# Patient Record
Sex: Male | Born: 1975 | Race: White | Hispanic: Yes | Marital: Married | Smoking: Light tobacco smoker
Health system: Southern US, Community
[De-identification: ages and names within clinical notes are randomized; demographics above are authoritative.]

## PROBLEM LIST (undated history)

## (undated) DIAGNOSIS — Z789 Other specified health status: Secondary | ICD-10-CM

## (undated) HISTORY — PX: NO PAST SURGERIES: SHX2092

---

## 2004-12-26 ENCOUNTER — Ambulatory Visit: Payer: Self-pay | Admitting: Sports Medicine

## 2005-01-11 ENCOUNTER — Ambulatory Visit: Payer: Self-pay | Admitting: Family Medicine

## 2005-02-21 ENCOUNTER — Ambulatory Visit: Payer: Self-pay | Admitting: Family Medicine

## 2005-03-21 ENCOUNTER — Ambulatory Visit: Payer: Self-pay | Admitting: Family Medicine

## 2005-05-23 ENCOUNTER — Ambulatory Visit: Payer: Self-pay | Admitting: Family Medicine

## 2005-06-27 ENCOUNTER — Ambulatory Visit: Payer: Self-pay | Admitting: Family Medicine

## 2007-09-19 ENCOUNTER — Ambulatory Visit: Payer: Self-pay | Admitting: Family Medicine

## 2007-09-19 ENCOUNTER — Encounter: Payer: Self-pay | Admitting: Family Medicine

## 2007-09-19 LAB — CONVERTED CEMR LAB
ALT: 71 units/L — ABNORMAL HIGH (ref 0–53)
Basophils Absolute: 0 10*3/uL (ref 0.0–0.1)
CO2: 24 meq/L (ref 19–32)
Calcium: 9.4 mg/dL (ref 8.4–10.5)
Chloride: 101 meq/L (ref 96–112)
Creatinine, Ser: 0.85 mg/dL (ref 0.40–1.50)
Hemoglobin: 15.2 g/dL (ref 13.0–17.0)
Lymphocytes Relative: 20 % (ref 12–46)
Lymphs Abs: 2.1 10*3/uL (ref 0.7–3.3)
Monocytes Absolute: 1.6 10*3/uL — ABNORMAL HIGH (ref 0.2–0.7)
Monocytes Relative: 15 % — ABNORMAL HIGH (ref 3–11)
Neutro Abs: 6.3 10*3/uL (ref 1.7–7.7)
RBC: 4.65 M/uL (ref 4.22–5.81)
WBC: 10.1 10*3/uL (ref 4.0–10.5)

## 2007-09-20 DIAGNOSIS — F17200 Nicotine dependence, unspecified, uncomplicated: Secondary | ICD-10-CM | POA: Insufficient documentation

## 2007-09-30 ENCOUNTER — Ambulatory Visit: Payer: Self-pay | Admitting: Family Medicine

## 2007-09-30 ENCOUNTER — Encounter (INDEPENDENT_AMBULATORY_CARE_PROVIDER_SITE_OTHER): Payer: Self-pay | Admitting: Family Medicine

## 2007-09-30 DIAGNOSIS — R51 Headache: Secondary | ICD-10-CM | POA: Insufficient documentation

## 2007-09-30 DIAGNOSIS — R6889 Other general symptoms and signs: Secondary | ICD-10-CM

## 2007-09-30 DIAGNOSIS — R519 Headache, unspecified: Secondary | ICD-10-CM | POA: Insufficient documentation

## 2007-10-02 LAB — CONVERTED CEMR LAB
Albumin: 4.1 g/dL (ref 3.5–5.2)
CO2: 26 meq/L (ref 19–32)
Calcium: 9.3 mg/dL (ref 8.4–10.5)
Chloride: 103 meq/L (ref 96–112)
Cholesterol: 163 mg/dL (ref 0–200)
Eosinophils Relative: 3 % (ref 0–5)
Glucose, Bld: 94 mg/dL (ref 70–99)
HCT: 45.5 % (ref 39.0–52.0)
Lymphocytes Relative: 15 % (ref 12–46)
Lymphs Abs: 1.7 10*3/uL (ref 0.7–4.0)
Neutrophils Relative %: 75 % (ref 43–77)
Platelets: 314 10*3/uL (ref 150–400)
Potassium: 4.6 meq/L (ref 3.5–5.3)
Sodium: 140 meq/L (ref 135–145)
Total Protein: 7.9 g/dL (ref 6.0–8.3)
Triglycerides: 108 mg/dL (ref <150)
WBC: 11.4 10*3/uL — ABNORMAL HIGH (ref 4.0–10.5)

## 2008-08-28 ENCOUNTER — Ambulatory Visit: Payer: Self-pay | Admitting: Family Medicine

## 2015-02-28 ENCOUNTER — Emergency Department (HOSPITAL_COMMUNITY): Admission: EM | Admit: 2015-02-28 | Discharge: 2015-02-28 | Discharge status: Absent without leave | Payer: Self-pay

## 2015-02-28 ENCOUNTER — Emergency Department (HOSPITAL_COMMUNITY)
Admission: EM | Admit: 2015-02-28 | Discharge: 2015-02-28 | Disposition: A | Discharge status: Home / Self Care | Payer: Self-pay | Attending: Emergency Medicine | Admitting: Emergency Medicine

## 2015-02-28 ENCOUNTER — Encounter (HOSPITAL_COMMUNITY): Payer: Self-pay | Admitting: *Deleted

## 2015-02-28 ENCOUNTER — Emergency Department (HOSPITAL_COMMUNITY): Payer: Self-pay

## 2015-02-28 DIAGNOSIS — Y288XXA Contact with other sharp object, undetermined intent, initial encounter: Secondary | ICD-10-CM | POA: Insufficient documentation

## 2015-02-28 DIAGNOSIS — S66321A Laceration of extensor muscle, fascia and tendon of left index finger at wrist and hand level, initial encounter: Secondary | ICD-10-CM | POA: Insufficient documentation

## 2015-02-28 DIAGNOSIS — Z23 Encounter for immunization: Secondary | ICD-10-CM | POA: Insufficient documentation

## 2015-02-28 DIAGNOSIS — Y929 Unspecified place or not applicable: Secondary | ICD-10-CM | POA: Insufficient documentation

## 2015-02-28 DIAGNOSIS — Y999 Unspecified external cause status: Secondary | ICD-10-CM | POA: Insufficient documentation

## 2015-02-28 DIAGNOSIS — S66822A Laceration of other specified muscles, fascia and tendons at wrist and hand level, left hand, initial encounter: Secondary | ICD-10-CM

## 2015-02-28 DIAGNOSIS — S61402A Unspecified open wound of left hand, initial encounter: Secondary | ICD-10-CM

## 2015-02-28 DIAGNOSIS — Y939 Activity, unspecified: Secondary | ICD-10-CM | POA: Insufficient documentation

## 2015-02-28 DIAGNOSIS — S61412A Laceration without foreign body of left hand, initial encounter: Secondary | ICD-10-CM | POA: Insufficient documentation

## 2015-02-28 MED ORDER — LIDOCAINE-EPINEPHRINE 1 %-1:100000 IJ SOLN
10.0000 mL | Freq: Once | INTRAMUSCULAR | Status: AC
  Administered 2015-02-28: 10 mL
  Filled 2015-02-28: qty 1

## 2015-02-28 MED ORDER — TETANUS-DIPHTH-ACELL PERTUSSIS 5-2.5-18.5 LF-MCG/0.5 IM SUSP
0.5000 mL | Freq: Once | INTRAMUSCULAR | Status: AC
  Administered 2015-02-28: 0.5 mL via INTRAMUSCULAR
  Filled 2015-02-28: qty 0.5

## 2015-02-28 MED ORDER — CEPHALEXIN 250 MG PO CAPS
500.0000 mg | ORAL_CAPSULE | Freq: Once | ORAL | Status: AC
  Administered 2015-02-28: 500 mg via ORAL
  Filled 2015-02-28: qty 2

## 2015-02-28 MED ORDER — CEPHALEXIN 500 MG PO CAPS: 500.0000 mg | ORAL_CAPSULE | Freq: Four times a day (QID) | ORAL | Status: DC

## 2015-02-28 MED ORDER — TRAMADOL HCL 50 MG PO TABS: 50.0000 mg | ORAL_TABLET | Freq: Four times a day (QID) | ORAL | Status: DC | PRN

## 2015-02-28 MED ORDER — OXYCODONE-ACETAMINOPHEN 5-325 MG PO TABS
2.0000 | ORAL_TABLET | Freq: Once | ORAL | Status: AC
  Administered 2015-02-28: 2 via ORAL
  Filled 2015-02-28: qty 2
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
## 2015-02-28 NOTE — ED Notes (Signed)
Patient transported to X-ray 

## 2015-02-28 NOTE — ED Notes (Signed)
Dr. Juleen Chinakohut at bedside suturing wound to left hand

## 2015-02-28 NOTE — Discharge Instructions (Signed)
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
## 2017-02-10 ENCOUNTER — Encounter (HOSPITAL_COMMUNITY): Payer: Self-pay | Admitting: Vascular Surgery

## 2017-02-10 ENCOUNTER — Emergency Department (HOSPITAL_COMMUNITY)
Admission: EM | Admit: 2017-02-10 | Discharge: 2017-02-10 | Disposition: A | Discharge status: Home / Self Care | Payer: Self-pay | Attending: Emergency Medicine | Admitting: Emergency Medicine

## 2017-02-10 ENCOUNTER — Emergency Department (HOSPITAL_COMMUNITY): Payer: Self-pay

## 2017-02-10 DIAGNOSIS — F17210 Nicotine dependence, cigarettes, uncomplicated: Secondary | ICD-10-CM | POA: Insufficient documentation

## 2017-02-10 DIAGNOSIS — Y92410 Unspecified street and highway as the place of occurrence of the external cause: Secondary | ICD-10-CM | POA: Insufficient documentation

## 2017-02-10 DIAGNOSIS — S43102A Unspecified dislocation of left acromioclavicular joint, initial encounter: Secondary | ICD-10-CM | POA: Insufficient documentation

## 2017-02-10 DIAGNOSIS — Y9389 Activity, other specified: Secondary | ICD-10-CM | POA: Insufficient documentation

## 2017-02-10 DIAGNOSIS — Y999 Unspecified external cause status: Secondary | ICD-10-CM | POA: Insufficient documentation

## 2017-02-10 MED ORDER — NAPROXEN 250 MG PO TABS
500.0000 mg | ORAL_TABLET | Freq: Once | ORAL | Status: AC
  Administered 2017-02-10: 500 mg via ORAL
  Filled 2017-02-10: qty 2

## 2017-02-10 MED ORDER — NAPROXEN 500 MG PO TABS: 500.0000 mg | ORAL_TABLET | Freq: Two times a day (BID) | ORAL | 0 refills | Status: DC
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
## 2017-02-10 NOTE — Discharge Instructions (Signed)
Take the medications as needed for pain, use the sling, follow up with an orthopedic doctor, call to schedule an appointment

## 2017-02-10 NOTE — Progress Notes (Signed)
Orthopedic Tech Progress Note Patient Details:  Bruce Lee [birth year] [id number]  Ortho Devices Type of Ortho Device: Shoulder immobilizer Ortho Device/Splint Location: LUE Ortho Device/Splint Interventions: Ordered, Application   Jennye Moccasin 02/10/2017, 11:09 PM
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
## 2017-03-26 ENCOUNTER — Emergency Department (HOSPITAL_COMMUNITY): Payer: Self-pay

## 2017-03-26 ENCOUNTER — Emergency Department (HOSPITAL_COMMUNITY)
Admission: EM | Admit: 2017-03-26 | Discharge: 2017-03-26 | Disposition: A | Discharge status: Home / Self Care | Payer: Self-pay | Attending: Emergency Medicine | Admitting: Emergency Medicine

## 2017-03-26 ENCOUNTER — Encounter (HOSPITAL_COMMUNITY): Payer: Self-pay

## 2017-03-26 DIAGNOSIS — Y999 Unspecified external cause status: Secondary | ICD-10-CM | POA: Insufficient documentation

## 2017-03-26 DIAGNOSIS — S01111A Laceration without foreign body of right eyelid and periocular area, initial encounter: Secondary | ICD-10-CM | POA: Insufficient documentation

## 2017-03-26 DIAGNOSIS — Z23 Encounter for immunization: Secondary | ICD-10-CM | POA: Insufficient documentation

## 2017-03-26 DIAGNOSIS — S0181XA Laceration without foreign body of other part of head, initial encounter: Secondary | ICD-10-CM

## 2017-03-26 DIAGNOSIS — Y929 Unspecified place or not applicable: Secondary | ICD-10-CM | POA: Insufficient documentation

## 2017-03-26 DIAGNOSIS — Y939 Activity, unspecified: Secondary | ICD-10-CM | POA: Insufficient documentation

## 2017-03-26 DIAGNOSIS — S0083XA Contusion of other part of head, initial encounter: Secondary | ICD-10-CM

## 2017-03-26 DIAGNOSIS — S0990XA Unspecified injury of head, initial encounter: Secondary | ICD-10-CM | POA: Insufficient documentation

## 2017-03-26 MED ORDER — LIDOCAINE-EPINEPHRINE (PF) 2 %-1:200000 IJ SOLN
20.0000 mL | Freq: Once | INTRAMUSCULAR | Status: AC
  Administered 2017-03-26: 20 mL
  Filled 2017-03-26: qty 20

## 2017-03-26 MED ORDER — TETANUS-DIPHTH-ACELL PERTUSSIS 5-2.5-18.5 LF-MCG/0.5 IM SUSP
0.5000 mL | Freq: Once | INTRAMUSCULAR | Status: AC
  Administered 2017-03-26: 0.5 mL via INTRAMUSCULAR
  Filled 2017-03-26: qty 0.5

## 2017-03-26 MED ORDER — BACITRACIN ZINC 500 UNIT/GM EX OINT
TOPICAL_OINTMENT | CUTANEOUS | Status: AC
  Filled 2017-03-26: qty 2.7

## 2017-03-26 NOTE — Discharge Instructions (Signed)
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
## 2018-05-27 ENCOUNTER — Emergency Department (HOSPITAL_COMMUNITY)
Admission: EM | Admit: 2018-05-27 | Discharge: 2018-05-27 | Disposition: A | Discharge status: Home / Self Care | Payer: PRIVATE HEALTH INSURANCE | Attending: Emergency Medicine | Admitting: Emergency Medicine

## 2018-05-27 ENCOUNTER — Emergency Department (HOSPITAL_COMMUNITY): Payer: PRIVATE HEALTH INSURANCE

## 2018-05-27 ENCOUNTER — Other Ambulatory Visit: Payer: Self-pay | Admitting: Orthopedic Surgery

## 2018-05-27 ENCOUNTER — Other Ambulatory Visit: Payer: Self-pay

## 2018-05-27 ENCOUNTER — Encounter (HOSPITAL_COMMUNITY): Payer: Self-pay | Admitting: Emergency Medicine

## 2018-05-27 ENCOUNTER — Encounter (HOSPITAL_COMMUNITY): Payer: Self-pay | Admitting: *Deleted

## 2018-05-27 DIAGNOSIS — S62232A Other displaced fracture of base of first metacarpal bone, left hand, initial encounter for closed fracture: Secondary | ICD-10-CM

## 2018-05-27 DIAGNOSIS — Y9289 Other specified places as the place of occurrence of the external cause: Secondary | ICD-10-CM | POA: Diagnosis not present

## 2018-05-27 DIAGNOSIS — S61012A Laceration without foreign body of left thumb without damage to nail, initial encounter: Secondary | ICD-10-CM | POA: Diagnosis not present

## 2018-05-27 DIAGNOSIS — Y990 Civilian activity done for income or pay: Secondary | ICD-10-CM | POA: Insufficient documentation

## 2018-05-27 DIAGNOSIS — Y939 Activity, unspecified: Secondary | ICD-10-CM | POA: Diagnosis not present

## 2018-05-27 DIAGNOSIS — W269XXA Contact with unspecified sharp object(s), initial encounter: Secondary | ICD-10-CM | POA: Insufficient documentation

## 2018-05-27 DIAGNOSIS — S62515A Nondisplaced fracture of proximal phalanx of left thumb, initial encounter for closed fracture: Secondary | ICD-10-CM | POA: Insufficient documentation

## 2018-05-27 DIAGNOSIS — S6992XA Unspecified injury of left wrist, hand and finger(s), initial encounter: Secondary | ICD-10-CM | POA: Diagnosis present

## 2018-05-27 DIAGNOSIS — Z23 Encounter for immunization: Secondary | ICD-10-CM | POA: Insufficient documentation

## 2018-05-27 MED ORDER — IBUPROFEN 800 MG PO TABS: 800.0000 mg | ORAL_TABLET | Freq: Three times a day (TID) | ORAL | 0 refills | Status: AC

## 2018-05-27 MED ORDER — IBUPROFEN 800 MG PO TABS
800.0000 mg | ORAL_TABLET | Freq: Once | ORAL | Status: AC
  Administered 2018-05-27: 800 mg via ORAL
  Filled 2018-05-27: qty 1

## 2018-05-27 MED ORDER — TETANUS-DIPHTH-ACELL PERTUSSIS 5-2.5-18.5 LF-MCG/0.5 IM SUSP
0.5000 mL | Freq: Once | INTRAMUSCULAR | Status: AC
  Administered 2018-05-27: 0.5 mL via INTRAMUSCULAR
  Filled 2018-05-27: qty 0.5

## 2018-05-27 MED ORDER — LIDOCAINE HCL (PF) 1 % IJ SOLN
30.0000 mL | Freq: Once | INTRAMUSCULAR | Status: AC
  Administered 2018-05-27: 30 mL
  Filled 2018-05-27: qty 30

## 2018-05-27 NOTE — ED Triage Notes (Signed)
Pt states 40 minutes ago he cut his left thumb and left middle finger while placing concrete, the cut was on metal. Pt has pain to thumb joint as well.

## 2018-05-27 NOTE — Progress Notes (Signed)
Orthopedic Tech Progress Note Patient Details:  [name] Mar 21, 1976 [id number]  Ortho Devices Type of Ortho Device: Ace wrap, Thumb spica splint Splint Material: Plaster Ortho Device/Splint Location: lue Ortho Device/Splint Interventions: Application   Post Interventions Patient Tolerated: Well Instructions Provided: Care of device   Nikki DomCrawford, Dwan Hemmelgarn 05/27/2018, 3:55 PM

## 2018-05-27 NOTE — H&P (View-Only) (Signed)
Reason for Consult:Left thumb fx Referring Physician: D [name] is an 42 y.o. male.  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 His left hand was on the ground and the plate of the machine came around and struck his hand. He came to the ED for evaluation. X-rays showed 1st prox phalanx and MC fxs and hand surgery was consulted. He is RHD.  History reviewed. No pertinent past medical history.  No past surgical history on file.  No family history on file.  Social History:  reports that he has never smoked. He has never used smokeless tobacco. He reports that he drinks alcohol. He reports that he does not use drugs.  Allergies: No Known Allergies  Medications: I have reviewed the patient's current medications.  No results found for this or any previous visit (from the past 48 hour(s)).  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 FINDINGS: Transverse oblique fracture of the proximal phalanx of thumb, non comminuted and nondisplaced with no significant angulation. This extends from the midshaft to the proximal metaphysis. It does not involve the articular surface. Comminuted transverse fracture of the base of the fifth metacarpal, mildly displaced in a radial direction by 3-4 mm. No other fractures.  The joints are normally aligned. Soft tissue injury noted over the IP joint region of the thumb. No radiopaque foreign bodies. IMPRESSION: 1. Comminuted mildly displaced fracture of the base of left first metacarpal. Nondisplaced fracture of the proximal phalanx of the left thumb. No other fractures. No dislocation. No radiopaque foreign bodies. Electronically Signed   By: Sulma Ruffino  Ormond M.D.   On: 05/27/2018 12:55    Review of Systems  Constitutional: Negative for  weight loss.  HENT: Negative for ear discharge, ear pain, hearing loss and tinnitus.   Eyes: Negative for blurred vision, double vision, photophobia and pain.  Respiratory: Negative for cough, sputum production and shortness of breath.   Cardiovascular: Negative for chest pain.  Gastrointestinal: Negative for abdominal pain, nausea and vomiting.  Genitourinary: Negative for dysuria, flank pain, frequency and urgency.  Musculoskeletal: Positive for joint pain (Left thumb and shoulder). Negative for back pain, falls, myalgias and neck pain.  Neurological: Negative for dizziness, tingling, sensory change, focal weakness, loss of consciousness and headaches.  Endo/Heme/Allergies: Does not bruise/bleed easily.  Psychiatric/Behavioral: Negative for depression, memory loss and substance abuse. The patient is not nervous/anxious.   All other systems reviewed and are negative.  Blood pressure (!) 145/101, pulse 73, temperature 98.6 F (37 C), temperature source Oral, resp. rate 20, SpO2 99 %. Physical Exam  Constitutional: He appears well-developed and well-nourished. No distress.  HENT:  Head: Normocephalic and atraumatic.  Eyes: Conjunctivae are normal. Right eye exhibits no discharge. Left eye exhibits no discharge. No scleral icterus.  Neck: Normal range of motion.  Cardiovascular: Normal rate and regular rhythm.  Respiratory: Effort normal. No respiratory distress.  Musculoskeletal:  Left shoulder, elbow, wrist, digits- Repaired laceration over P3, TTP P1 and thenar, mild, no instability, no blocks to motion  Sens  Ax/R/M/U intact  Mot   Ax/ R/ PIN/ M/ AIN/ U intact  Rad 2+  Neurological: He is alert.  Skin: Skin is warm and dry. He is not diaphoretic.  Psychiatric: He has a normal mood and affect. His behavior is normal.      Assessment/Plan: Left thumb prox phalanx, MC fxs -- Will plan on CRPP tomorrow afternoon here at [facility] by Dr. Karinna Beadles. NPO after MN. Thumb spica splint to go  home.    Michael J. Jeffery, PA-C Orthopedic Surgery [phone] 05/27/2018, 3:12 PM   Agree with hx and exam findings.  L 1st MC proximal metaphyseal fx with angulation,  P1 fx  Relatively non-displaced.  Unfortunately, his NPO status precludes OR treatment this afternoon.  Instead, we will proceed tomorrow with likely CRPP of L 1st MC fx and non-op tx of L thumb P1 fx.  Dave Darwyn Ponzo,  MD Hand Surgery 

## 2018-05-27 NOTE — ED Notes (Signed)
ED Provider at bedside. 

## 2018-05-27 NOTE — ED Provider Notes (Signed)
[facility]  CSN: [id number] Arrival date & time: 05/27/18  1206   History   Chief Complaint Chief Complaint  Patient presents with  . Laceration    HPI Bruce Lee is a 42 y.o. male with no significant medical history who presented to the ED for a left thumb pain. Patient states he cut his left thumb on metal while he work ~ 1 hour before coming to the ED. He endorses abrasions to his left middle finger as well. He endorses difficulty with grip and it is painful to move the left thumb. Denies color or temperature change or paresthesias. He is unsure if there is any foreign bodies. Patient is not on an anticoagulant.  History reviewed. No pertinent past medical history.  Patient Active Problem List   Diagnosis Date Noted  . OTHER GENERAL SYMPTOMS 09/30/2007  . HEADACHE 09/30/2007  . CIGARETTE SMOKER 09/20/2007    No past surgical history on file.      Home Medications    Prior to Admission medications   Medication Sig Start Date End Date Taking? Authorizing Provider  ibuprofen (ADVIL,MOTRIN) 800 MG tablet Take 1 tablet (800 mg total) by mouth 3 (three) times daily for 10 days. 05/27/18 06/06/18  Desiderio Dolata, Sharyon MedicusGabrielle I, PA-C    Family History No family history on file.  Social History Social History   Tobacco Use  . Smoking status: Never Smoker  . Smokeless tobacco: Never Used  Substance Use Topics  . Alcohol use: Yes  . Drug use: No     Allergies   Patient has no known allergies.   Review of Systems Review of Systems  Constitutional: Negative for chills and fever.  Musculoskeletal: Positive for arthralgias and joint swelling.  Skin: Positive for wound. Negative for color change and pallor.  Neurological: Negative for weakness and numbness.  Hematological: Negative.      Physical Exam Updated Vital Signs BP (!) 145/96 (BP Location: Right Arm)   Pulse 72   Temp 98.6 F (37 C) (Oral)   Resp 16    SpO2 97%   Physical Exam  Constitutional: He appears well-developed and well-nourished. No distress.  Cardiovascular:  Pulses:      Radial pulses are 2+ on the right side, and 2+ on the left side.  Musculoskeletal:       Left hand: He exhibits decreased range of motion, tenderness, bony tenderness, laceration and swelling. He exhibits normal capillary refill and no deformity. Normal sensation noted. Decreased strength noted. He exhibits thumb/finger opposition. He exhibits no wrist extension trouble.  ~3cm laceration down 1st DIP joint. Left thumb is swollen with muscular and bony tenderness to palpation. Unable to perform full grip or opposition movements due to pain. Remaining 4 digits have full ROM and strength.   Neurological: He has normal reflexes. No sensory deficit. He exhibits normal muscle tone.  Skin: Skin is warm. Capillary refill takes less than 2 seconds. Laceration noted.  Nursing note and vitals reviewed.       ED Treatments / Results  Labs (all labs ordered are listed, but only abnormal results are displayed) Labs Reviewed - No data to display  EKG None  Radiology Dg Hand Complete Left  Result Date: 05/27/2018 CLINICAL DATA:  WORK INJURY.HIT BY CONCRETE MIXER.''PAIN LEFT THUMB /PROX METACARPAL.PHALANX.'''LACERATIONS TO FINGERS EXAM: LEFT HAND - COMPLETE 3+ VIEW COMPARISON:  None. FINDINGS: Transverse oblique fracture of the proximal phalanx of thumb, non comminuted and nondisplaced with no significant angulation. This  extends from the midshaft to the proximal metaphysis. It does not involve the articular surface. Comminuted transverse fracture of the base of the fifth metacarpal, mildly displaced in a radial direction by 3-4 mm. No other fractures.  The joints are normally aligned. Soft tissue injury noted over the IP joint region of the thumb. No radiopaque foreign bodies. IMPRESSION: 1. Comminuted mildly displaced fracture of the base of left first metacarpal.  Nondisplaced fracture of the proximal phalanx of the left thumb. No other fractures. No dislocation. No radiopaque foreign bodies. Electronically Signed   By: Amie Portland M.D.   On: 05/27/2018 12:55    Procedures .Marland KitchenLaceration Repair Date/Time: 05/27/2018 3:03 PM Performed by: Windy Carina, PA-C Authorized by: Windy Carina, PA-C   Consent:    Consent obtained:  Verbal   Risks discussed:  Infection, pain, poor cosmetic result, poor wound healing and need for additional repair   Alternatives discussed:  No treatment Anesthesia (see MAR for exact dosages):    Anesthesia method:  Nerve block   Block needle gauge:  25 G   Block anesthetic:  Lidocaine 1% w/o epi   Block injection procedure:  Anatomic landmarks identified, introduced needle, anatomic landmarks palpated and incremental injection   Block outcome:  Anesthesia achieved Laceration details:    Location:  Finger   Finger location:  L thumb   Length (cm):  4   Depth (mm):  3 Repair type:    Repair type:  Simple Pre-procedure details:    Preparation:  Patient was prepped and draped in usual sterile fashion and imaging obtained to evaluate for foreign bodies Exploration:    Hemostasis achieved with:  Direct pressure   Wound exploration: wound explored through full range of motion and entire depth of wound probed and visualized     Wound extent: no muscle damage noted and no tendon damage noted     Contaminated: no   Treatment:    Area cleansed with:  Betadine   Amount of cleaning:  Standard   Irrigation solution:  Sterile saline   Irrigation method:  Syringe   Visualized foreign bodies/material removed: no   Skin repair:    Repair method:  Sutures   Suture size:  4-0   Suture material:  Prolene   Number of sutures:  3 Approximation:    Approximation:  Loose Post-procedure details:    Patient tolerance of procedure:  Tolerated well, no immediate complications   (including critical care  time)  Medications Ordered in ED Medications  lidocaine (PF) (XYLOCAINE) 1 % injection 30 mL (30 mLs Infiltration Given 05/27/18 1338)  Tdap (BOOSTRIX) injection 0.5 mL (0.5 mLs Intramuscular Given 05/27/18 1338)  ibuprofen (ADVIL,MOTRIN) tablet 800 mg (800 mg Oral Given 05/27/18 1338)     Initial Impression / Assessment and Plan / ED Course  Triage vital signs and the nursing notes have been reviewed.  Pertinent labs & imaging results that were available during care of the patient were reviewed and considered in medical decision making (see chart for details).  Patient presents with a left thumb laceration. There were no foreign bodies seen on x-ray or physical exam. Laceration repaired in the ED without complications. Due to injury, patient also suffered 2 fractures. Hand surgeon was consulted. Thumb spica splint applied in the ED and patient can be managed as an outpatient and has an appointment scheduled for tomorrow 05/28/18.  Clinical Course as of May 27 1622  Mon May 27, 2018  1410 X-ray showed comminuted fracture  of left 1st metacarpal and nondisplaced proximal phalanx fracture. Pt had adequate pain control with ibuprofen.   [GM]  1505 Case discussed with hand PA-C. Hand will follow-up with patient on outpatient basis.   [GM]    Clinical Course User Index [GM] Lavoris Canizales, Sharyon Medicus, PA-C   Final Clinical Impressions(s) / ED Diagnoses  1. Thumb Laceration. 3 sutures applied in the ED. Education provided on proper wound care and follow-up. Education provided on s/s that would warrant return to the ED. 2. Thumb Fractures. Comminuted 1st metacarpal and displaced proximal phalanx. Hand consult placed. No reductions or interventions needed in the ED today. Thumb spica splint applied and patient will follow-up as outpatient.  Dispo: Home. After thorough clinical evaluation, this patient is determined to be medically stable and can be safely discharged with the previously mentioned treatment  and/or outpatient follow-up/referral(s). At this time, there are no other apparent medical conditions that require further screening, evaluation or treatment.   Final diagnoses:  Laceration of left thumb without foreign body without damage to nail, initial encounter  Closed nondisplaced fracture of proximal phalanx of left thumb, initial encounter  Closed displaced fracture of base of first metacarpal bone of left hand, unspecified fracture morphology, initial encounter    ED Discharge Orders        Ordered    ibuprofen (ADVIL,MOTRIN) 800 MG tablet  3 times daily     05/27/18 1615        Havanah Nelms, Kline I, PA-C 05/27/18 1623    Melene Plan, DO 05/28/18 [phone]

## 2018-05-27 NOTE — Discharge Instructions (Signed)
Follow-up with the hand surgeon, Dr. Janee Mornhompson, tomorrow as you have been instructed to do.  You may go to a medical provider (PCP, urgent care, emergency) in 5-7 days to get the stitches in your thumb removed.   I have prescribed you more ibuprofen to help with the pain.  Thank you for allowing me to take care of you today!

## 2018-05-27 NOTE — Consult Note (Addendum)
Reason for Consult:Left thumb fx Referring Physician: D Pamalee LeydenFloyd  Bruce Lee is an 42 y.o. male.  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 His left hand was on the ground and the plate of the machine came around and struck his hand. He came to the ED for evaluation. X-rays showed 1st prox phalanx and MC fxs and hand surgery was consulted. He is RHD.  History reviewed. No pertinent past medical history.  No past surgical history on file.  No family history on file.  Social History:  reports that he has never smoked. He has never used smokeless tobacco. He reports that he drinks alcohol. He reports that he does not use drugs.  Allergies: No Known Allergies  Medications: I have reviewed the patient's current medications.  No results found for this or any previous visit (from the past 48 hour(s)).  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 FINDINGS: Transverse oblique fracture of the proximal phalanx of thumb, non comminuted and nondisplaced with no significant angulation. This extends from the midshaft to the proximal metaphysis. It does not involve the articular surface. Comminuted transverse fracture of the base of the fifth metacarpal, mildly displaced in a radial direction by 3-4 mm. No other fractures.  The joints are normally aligned. Soft tissue injury noted over the IP joint region of the thumb. No radiopaque foreign bodies. IMPRESSION: 1. Comminuted mildly displaced fracture of the base of left first metacarpal. Nondisplaced fracture of the proximal phalanx of the left thumb. No other fractures. No dislocation. No radiopaque foreign bodies. Electronically Signed   By: Amie Portlandavid  Ormond M.D.   On: 05/27/2018 12:55    Review of Systems  Constitutional: Negative for  weight loss.  HENT: Negative for ear discharge, ear pain, hearing loss and tinnitus.   Eyes: Negative for blurred vision, double vision, photophobia and pain.  Respiratory: Negative for cough, sputum production and shortness of breath.   Cardiovascular: Negative for chest pain.  Gastrointestinal: Negative for abdominal pain, nausea and vomiting.  Genitourinary: Negative for dysuria, flank pain, frequency and urgency.  Musculoskeletal: Positive for joint pain (Left thumb and shoulder). Negative for back pain, falls, myalgias and neck pain.  Neurological: Negative for dizziness, tingling, sensory change, focal weakness, loss of consciousness and headaches.  Endo/Heme/Allergies: Does not bruise/bleed easily.  Psychiatric/Behavioral: Negative for depression, memory loss and substance abuse. The patient is not nervous/anxious.   All other systems reviewed and are negative.  Blood pressure (!) 145/101, pulse 73, temperature 98.6 F (37 C), temperature source Oral, resp. rate 20, SpO2 99 %. Physical Exam  Constitutional: He appears well-developed and well-nourished. No distress.  HENT:  Head: Normocephalic and atraumatic.  Eyes: Conjunctivae are normal. Right eye exhibits no discharge. Left eye exhibits no discharge. No scleral icterus.  Neck: Normal range of motion.  Cardiovascular: Normal rate and regular rhythm.  Respiratory: Effort normal. No respiratory distress.  Musculoskeletal:  Left shoulder, elbow, wrist, digits- Repaired laceration over P3, TTP P1 and thenar, mild, no instability, no blocks to motion  Sens  Ax/R/M/U intact  Mot   Ax/ R/ PIN/ M/ AIN/ U intact  Rad 2+  Neurological: He is alert.  Skin: Skin is warm and dry. He is not diaphoretic.  Psychiatric: He has a normal mood and affect. His behavior is normal.  Assessment/Plan: Left thumb prox phalanx, MC fxs -- Will plan on CRPP tomorrow afternoon here at [facility] by Dr. Janee Morn. NPO after MN. Thumb spica splint to go  home.    Freeman Caldron, PA-C Orthopedic Surgery [phone] 05/27/2018, 3:12 PM   Agree with hx and exam findings.  L 1st MC proximal metaphyseal fx with angulation,  P1 fx  Relatively non-displaced.  Unfortunately, his NPO status precludes OR treatment this afternoon.  Instead, we will proceed tomorrow with likely CRPP of L 1st MC fx and non-op tx of L thumb P1 fx.  Bruce Crouch,  MD Hand Surgery

## 2018-05-27 NOTE — ED Notes (Signed)
Pt was in xray, placed in room at this time

## 2018-05-27 NOTE — ED Notes (Signed)
Wound care being completed by EMT

## 2018-05-27 NOTE — ED Notes (Signed)
Called for patient twice, no answer.

## 2018-05-27 NOTE — ED Notes (Signed)
Paged ortho 

## 2018-05-28 ENCOUNTER — Encounter (HOSPITAL_COMMUNITY)
Admission: RE | Disposition: A | Discharge status: Home / Self Care | Payer: Self-pay | Source: Ambulatory Visit | Attending: Orthopedic Surgery

## 2018-05-28 ENCOUNTER — Encounter (HOSPITAL_COMMUNITY): Payer: Self-pay | Admitting: *Deleted

## 2018-05-28 ENCOUNTER — Ambulatory Visit (HOSPITAL_COMMUNITY)
Admission: RE | Admit: 2018-05-28 | Discharge: 2018-05-28 | Disposition: A | Discharge status: Home / Self Care | Payer: PRIVATE HEALTH INSURANCE | Source: Ambulatory Visit | Attending: Orthopedic Surgery | Admitting: Orthopedic Surgery

## 2018-05-28 ENCOUNTER — Ambulatory Visit (HOSPITAL_COMMUNITY): Payer: PRIVATE HEALTH INSURANCE | Admitting: Anesthesiology

## 2018-05-28 ENCOUNTER — Ambulatory Visit (HOSPITAL_COMMUNITY): Payer: PRIVATE HEALTH INSURANCE

## 2018-05-28 DIAGNOSIS — W3189XA Contact with other specified machinery, initial encounter: Secondary | ICD-10-CM | POA: Insufficient documentation

## 2018-05-28 DIAGNOSIS — S62515A Nondisplaced fracture of proximal phalanx of left thumb, initial encounter for closed fracture: Secondary | ICD-10-CM | POA: Diagnosis present

## 2018-05-28 DIAGNOSIS — T148XXA Other injury of unspecified body region, initial encounter: Secondary | ICD-10-CM

## 2018-05-28 DIAGNOSIS — S62232A Other displaced fracture of base of first metacarpal bone, left hand, initial encounter for closed fracture: Secondary | ICD-10-CM | POA: Diagnosis not present

## 2018-05-28 HISTORY — PX: PERCUTANEOUS PINNING: SHX2209

## 2018-05-28 HISTORY — DX: Other specified health status: Z78.9

## 2018-05-28 LAB — HEMOGLOBIN: Hemoglobin: 16.3 g/dL (ref 13.0–17.0)

## 2018-05-28 SURGERY — PINNING, EXTREMITY, PERCUTANEOUS
Anesthesia: General | Laterality: Left

## 2018-05-28 MED ORDER — BUPIVACAINE-EPINEPHRINE 0.5% -1:200000 IJ SOLN
INTRAMUSCULAR | Status: DC | PRN
  Administered 2018-05-28: 8 mL

## 2018-05-28 MED ORDER — ACETAMINOPHEN 325 MG PO TABS: 650.0000 mg | ORAL_TABLET | Freq: Four times a day (QID) | ORAL | Status: AC

## 2018-05-28 MED ORDER — CEFAZOLIN SODIUM-DEXTROSE 2-4 GM/100ML-% IV SOLN
2.0000 g | INTRAVENOUS | Status: AC
  Administered 2018-05-28: 2 g via INTRAVENOUS

## 2018-05-28 MED ORDER — FENTANYL CITRATE (PF) 100 MCG/2ML IJ SOLN
INTRAMUSCULAR | Status: AC
  Filled 2018-05-28: qty 2

## 2018-05-28 MED ORDER — PROPOFOL 10 MG/ML IV BOLUS
INTRAVENOUS | Status: DC | PRN
  Administered 2018-05-28: 200 mg via INTRAVENOUS

## 2018-05-28 MED ORDER — FENTANYL CITRATE (PF) 100 MCG/2ML IJ SOLN: 25.0000 ug | INTRAMUSCULAR | Status: DC | PRN

## 2018-05-28 MED ORDER — BUPIVACAINE-EPINEPHRINE (PF) 0.5% -1:200000 IJ SOLN
INTRAMUSCULAR | Status: AC
  Filled 2018-05-28: qty 30

## 2018-05-28 MED ORDER — OXYCODONE HCL 5 MG PO TABS: 5.0000 mg | ORAL_TABLET | Freq: Four times a day (QID) | ORAL | 0 refills | Status: AC | PRN

## 2018-05-28 MED ORDER — OXYCODONE HCL 5 MG/5ML PO SOLN: 5.0000 mg | Freq: Once | ORAL | Status: DC | PRN

## 2018-05-28 MED ORDER — LACTATED RINGERS IV SOLN
INTRAVENOUS | Status: DC
  Administered 2018-05-28: 13:00:00 via INTRAVENOUS

## 2018-05-28 MED ORDER — MIDAZOLAM HCL 2 MG/2ML IJ SOLN
INTRAMUSCULAR | Status: AC
  Filled 2018-05-28: qty 2

## 2018-05-28 MED ORDER — MIDAZOLAM HCL 2 MG/2ML IJ SOLN
INTRAMUSCULAR | Status: DC | PRN
  Administered 2018-05-28: 2 mg via INTRAVENOUS

## 2018-05-28 MED ORDER — BUPIVACAINE HCL (PF) 0.5 % IJ SOLN
INTRAMUSCULAR | Status: AC
  Filled 2018-05-28: qty 30

## 2018-05-28 MED ORDER — ONDANSETRON HCL 4 MG/2ML IJ SOLN: 4.0000 mg | Freq: Once | INTRAMUSCULAR | Status: DC | PRN

## 2018-05-28 MED ORDER — DEXAMETHASONE SODIUM PHOSPHATE 10 MG/ML IJ SOLN
INTRAMUSCULAR | Status: DC | PRN
  Administered 2018-05-28: 10 mg via INTRAVENOUS

## 2018-05-28 MED ORDER — OXYCODONE HCL 5 MG PO TABS: 5.0000 mg | ORAL_TABLET | Freq: Once | ORAL | Status: DC | PRN

## 2018-05-28 MED ORDER — CEFAZOLIN SODIUM-DEXTROSE 2-4 GM/100ML-% IV SOLN
INTRAVENOUS | Status: AC
  Filled 2018-05-28: qty 100

## 2018-05-28 MED ORDER — FENTANYL CITRATE (PF) 250 MCG/5ML IJ SOLN
INTRAMUSCULAR | Status: AC
  Filled 2018-05-28: qty 5

## 2018-05-28 MED ORDER — LIDOCAINE HCL (CARDIAC) PF 100 MG/5ML IV SOSY
PREFILLED_SYRINGE | INTRAVENOUS | Status: DC | PRN
  Administered 2018-05-28: 80 mg via INTRAVENOUS

## 2018-05-28 MED ORDER — FENTANYL CITRATE (PF) 250 MCG/5ML IJ SOLN
INTRAMUSCULAR | Status: DC | PRN
  Administered 2018-05-28 (×3): 50 ug via INTRAVENOUS

## 2018-05-28 SURGICAL SUPPLY — 30 items
BANDAGE ACE 3X5.8 VEL STRL LF (GAUZE/BANDAGES/DRESSINGS) ×3 IMPLANT
BANDAGE ACE 4X5 VEL STRL LF (GAUZE/BANDAGES/DRESSINGS) ×3 IMPLANT
BNDG COHESIVE 4X5 TAN STRL (GAUZE/BANDAGES/DRESSINGS) ×3 IMPLANT
BNDG GAUZE ELAST 4 BULKY (GAUZE/BANDAGES/DRESSINGS) IMPLANT
CHLORAPREP W/TINT 10.5 ML (MISCELLANEOUS) ×3 IMPLANT
COVER SURGICAL LIGHT HANDLE (MISCELLANEOUS) ×3 IMPLANT
CUFF TOURNIQUET SINGLE 18IN (TOURNIQUET CUFF) ×3 IMPLANT
DECANTER SPIKE VIAL GLASS SM (MISCELLANEOUS) ×3 IMPLANT
DRESSING ADAPTIC 1/2  N-ADH (PACKING) ×3 IMPLANT
GAUZE SPONGE 4X4 12PLY STRL (GAUZE/BANDAGES/DRESSINGS) ×3 IMPLANT
GAUZE SPONGE 4X4 12PLY STRL LF (GAUZE/BANDAGES/DRESSINGS) ×3 IMPLANT
GLOVE BIO SURGEON STRL SZ7.5 (GLOVE) ×3 IMPLANT
GLOVE BIOGEL PI IND STRL 8 (GLOVE) ×1 IMPLANT
GLOVE BIOGEL PI INDICATOR 8 (GLOVE) ×2
GOWN STRL REUS W/ TWL LRG LVL3 (GOWN DISPOSABLE) ×2 IMPLANT
GOWN STRL REUS W/TWL LRG LVL3 (GOWN DISPOSABLE) ×4
K-WIRE DBL TROCAR .045X4 ×9 IMPLANT
KIT BASIN OR (CUSTOM PROCEDURE TRAY) ×3 IMPLANT
KIT TURNOVER KIT B (KITS) ×3 IMPLANT
KWIRE DBL TROCAR .045X4 ×3 IMPLANT
NEEDLE HYPO 25X1 1.5 SAFETY (NEEDLE) ×3 IMPLANT
PACK ORTHO EXTREMITY (CUSTOM PROCEDURE TRAY) ×3 IMPLANT
PAD ARMBOARD 7.5X6 YLW CONV (MISCELLANEOUS) ×6 IMPLANT
PAD CAST 4YDX4 CTTN HI CHSV (CAST SUPPLIES) ×1 IMPLANT
PADDING CAST COTTON 4X4 STRL (CAST SUPPLIES) ×2
SPLINT PLASTER CAST XFAST 3X15 (CAST SUPPLIES) ×1 IMPLANT
SPLINT PLASTER XTRA FASTSET 3X (CAST SUPPLIES) ×2
SYR CONTROL 10ML LL (SYRINGE) ×3 IMPLANT
TOWEL OR 17X24 6PK STRL BLUE (TOWEL DISPOSABLE) ×3 IMPLANT
TOWEL OR 17X26 10 PK STRL BLUE (TOWEL DISPOSABLE) ×3 IMPLANT

## 2018-05-28 NOTE — Interval H&P Note (Signed)
History and Physical Interval Note:  05/28/2018 2:35 PM  Bruce Lee  has presented today for surgery, with the diagnosis of LEFT 1ST METACARPAL FRACTURE  The various methods of treatment have been discussed with the patient and family. After consideration of risks, benefits and other options for treatment, the patient has consented to  Procedure(s): PERCUTANEOUS PINNING EXTREMITY (Left) as a surgical intervention .  The patient's history has been reviewed, patient examined, no change in status, stable for surgery.  I have reviewed the patient's chart and labs.  Questions were answered to the patient's satisfaction.     Jodi Marbleavid A Lyndel Sarate

## 2018-05-28 NOTE — Anesthesia Preprocedure Evaluation (Signed)
Anesthesia Evaluation  Patient identified by MRN, date of birth, ID band Patient awake    Reviewed: Allergy & Precautions, NPO status , Patient's Chart, lab work & pertinent test results  Airway Mallampati: II  TM Distance: >3 FB Neck ROM: Full    Dental  (+) Teeth Intact, Dental Advisory Given   Pulmonary Current Smoker,    breath sounds clear to auscultation       Cardiovascular  Rhythm:Regular Rate:Normal     Neuro/Psych    GI/Hepatic   Endo/Other    Renal/GU      Musculoskeletal   Abdominal   Peds  Hematology   Anesthesia Other Findings   Reproductive/Obstetrics                             Anesthesia Physical Anesthesia Plan  ASA: I  Anesthesia Plan: General   Post-op Pain Management:    Induction: Intravenous  PONV Risk Score and Plan:   Airway Management Planned: LMA  Additional Equipment:   Intra-op Plan:   Post-operative Plan: Extubation in OR  Informed Consent: I have reviewed the patients History and Physical, chart, labs and discussed the procedure including the risks, benefits and alternatives for the proposed anesthesia with the patient or authorized representative who has indicated his/her understanding and acceptance.   Dental advisory given  Plan Discussed with: CRNA and Anesthesiologist  Anesthesia Plan Comments:         Anesthesia Quick Evaluation

## 2018-05-28 NOTE — Op Note (Signed)
05/28/2018  2:35 PM  PATIENT:  Bruce Lee  42 y.o. male  PRE-OPERATIVE DIAGNOSIS: Left first metacarpal fracture and proximal phalanx fracture  POST-OPERATIVE DIAGNOSIS:  Same  PROCEDURE: CRPP left first metacarpal and proximal phalanx fractures  SURGEON: Cliffton Astersavid A. Janee Mornhompson, MD  PHYSICIAN ASSISTANT: Danielle RankinKirsten Schrader, PA-C  ANESTHESIA:  general  SPECIMENS:  None  DRAINS:   None  EBL:  less than 50 mL  PREOPERATIVE INDICATIONS:  [name] is a  42 y.o. male with displaced fracture of the proximal aspect of the left first metacarpal, and nondisplaced left proximal phalanx fracture  The risks benefits and alternatives were discussed with the patient preoperatively including but not limited to the risks of infection, bleeding, nerve injury, cardiopulmonary complications, the need for revision surgery, among others, and the patient verbalized understanding and consented to proceed.  OPERATIVE IMPLANTS: 0.045 inch K wires x3  OPERATIVE PROCEDURE:  After receiving prophylactic antibiotics, the patient was escorted to the operative theatre and placed in a supine position.  A surgical "time-out" was performed during which the planned procedure, proposed operative site, and the correct patient identity were compared to the operative consent and agreement confirmed by the circulating nurse according to current facility policy.  Following application of a tourniquet to the operative extremity, the exposed skin was pre-scrubbed with a Hibiclens prep brush before being formally prepped with Chloraprep and draped in the usual sterile fashion.    The first metacarpal was manipulated, and anatomic alignment could be achieved.  It was secured with 2 different crossed K wires, 0.045 inches each.  In the process of examining it under fluoroscopy, it was determined that the proximal phalanx was actually potentially unstable, able to flex and extend through the fracture site.  For this reason, a  single oblique K wire was placed across the fracture, securing it, at the completion, under fluoroscopy, the fracture did not change in alignment with manipulation.  All the pins were bent over at the skin and clipped.  Local anesthetic mixture was placed in the subcutaneous layer proximal to all the pin sites and short arm thumb spica splint dressing applied.  He was awakened and taken to the recovery in stable condition, breathing spontaneously.  DISPOSITION: To be discharged home today with typical instructions, returning the week of the 29th for reevaluation, with new x-rays of the left thumb out of splint, removal of sutures, and conversion to short arm thumb spica cast.

## 2018-05-28 NOTE — Anesthesia Procedure Notes (Signed)
Procedure Name: LMA Insertion Date/Time: 05/28/2018 4:08 PM Performed by: Ponciano OrtBrewer, Cole Klugh, CRNA Pre-anesthesia Checklist: Patient identified, Emergency Drugs available, Suction available and Patient being monitored Patient Re-evaluated:Patient Re-evaluated prior to induction Oxygen Delivery Method: Circle system utilized Preoxygenation: Pre-oxygenation with 100% oxygen Induction Type: IV induction LMA: LMA inserted LMA Size: 5.0 Number of attempts: 1 Placement Confirmation: ETT inserted through vocal cords under direct vision,  positive ETCO2 and breath sounds checked- equal and bilateral Tube secured with: Tape Dental Injury: Teeth and Oropharynx as per pre-operative assessment

## 2018-05-28 NOTE — Discharge Instructions (Signed)
Discharge Instructions   You have a dressing with a plaster splint incorporated in it. Move your fingers as much as possible, making a full fist and fully opening the fist. Elevate your hand to reduce pain & swelling of the digits.  Ice over the operative site may be helpful to reduce pain & swelling.  DO NOT USE HEAT. Pain medicine has been prescribed for you.  Continue 800 mg ibuprofen as prescribed. When this prescription is completed, you can switch to OTC ibuprofen. Take 600 mg Ibuprofen and Tylenol 650 mg every 6 hours together. Take the Oxycodone 5 mg for severe pain as a rescue medicine. Leave the dressing in place until you return to our office.  You may shower, but keep the bandage clean & dry.  You may drive a car when you are off of prescription pain medications and can safely control your vehicle with both hands. Call our office and arrange a follow up appointment for the week of July 29th, 2019   Please call [phone] during normal business hours or [phone] after hours for any problems. Including the following:  - excessive redness of the incisions - drainage for more than 4 days - fever of more than 101.5 F  *Please note that pain medications will not be refilled after hours or on weekends.  Work Status: No lifting, gripping and grasping greater than pencil and paper tasks with the left hand.

## 2018-05-28 NOTE — Transfer of Care (Signed)
Immediate Anesthesia Transfer of Care Note  Patient: Bruce Lee  Procedure(s) Performed: PERCUTANEOUS PINNING EXTREMITY (Left )  Patient Location: PACU  Anesthesia Type:General  Level of Consciousness: awake, alert , oriented and patient cooperative  Airway & Oxygen Therapy: Patient Spontanous Breathing and Patient connected to face mask oxygen  Post-op Assessment: Report given to RN and Post -op Vital signs reviewed and stable  Post vital signs: Reviewed and stable  Last Vitals:  Vitals Value Taken Time  BP 157/94 05/28/2018  4:47 PM  Temp    Pulse 116 05/28/2018  4:53 PM  Resp 19 05/28/2018  4:53 PM  SpO2 96 % 05/28/2018  4:53 PM  Vitals shown include unvalidated device data.  Last Pain:  Vitals:   05/28/18 1320  TempSrc:   PainSc: 2       Patients Stated Pain Goal: 3 (05/28/18 1320)  Complications: No apparent anesthesia complications

## 2018-05-29 ENCOUNTER — Encounter (HOSPITAL_COMMUNITY): Payer: Self-pay | Admitting: Orthopedic Surgery

## 2018-05-29 NOTE — Anesthesia Postprocedure Evaluation (Signed)
Anesthesia Post Note  Patient: Myreon Duffner  Procedure(s) Performed: PERCUTANEOUS PINNING EXTREMITY (Left )     Patient location during evaluation: PACU Anesthesia Type: General Level of consciousness: awake and alert Pain management: pain level controlled Vital Signs Assessment: post-procedure vital signs reviewed and stable Respiratory status: spontaneous breathing, nonlabored ventilation, respiratory function stable and patient connected to nasal cannula oxygen Cardiovascular status: blood pressure returned to baseline and stable Postop Assessment: no apparent nausea or vomiting Anesthetic complications: no    Last Vitals:  Vitals:   05/28/18 1704 05/28/18 1721  BP: (!) 158/88 (!) 156/87  Pulse: (!) 102 98  Resp: 16 19  Temp:  (!) 36.1 C  SpO2: 95% 95%    Last Pain:  Vitals:   05/28/18 1721  TempSrc:   PainSc: 0-No pain                 Phillips Groutarignan, Vielka Klinedinst
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
## 2018-09-26 IMAGING — RF DG C-ARM 61-120 MIN
1 series · 2 of 2 positions shown · non-contrast
Comparison: Left hand x-rays from yesterday.

CLINICAL DATA: Left thumb pinning.

EXAM:
LEFT HAND - 2 VIEW; DG C-ARM 61-120 MIN

[Series 1: run · 2 of 2 slices shown]
[im 1/2]
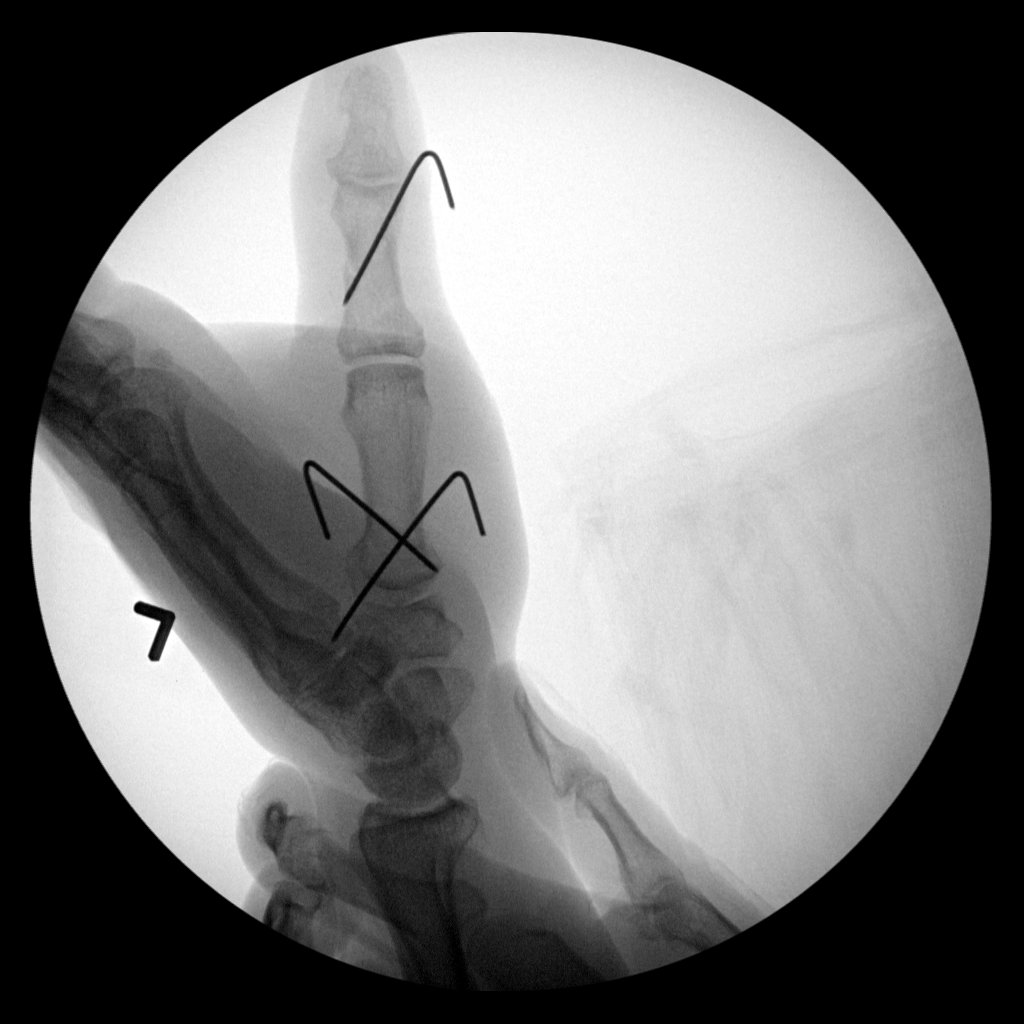
[im 2/2]
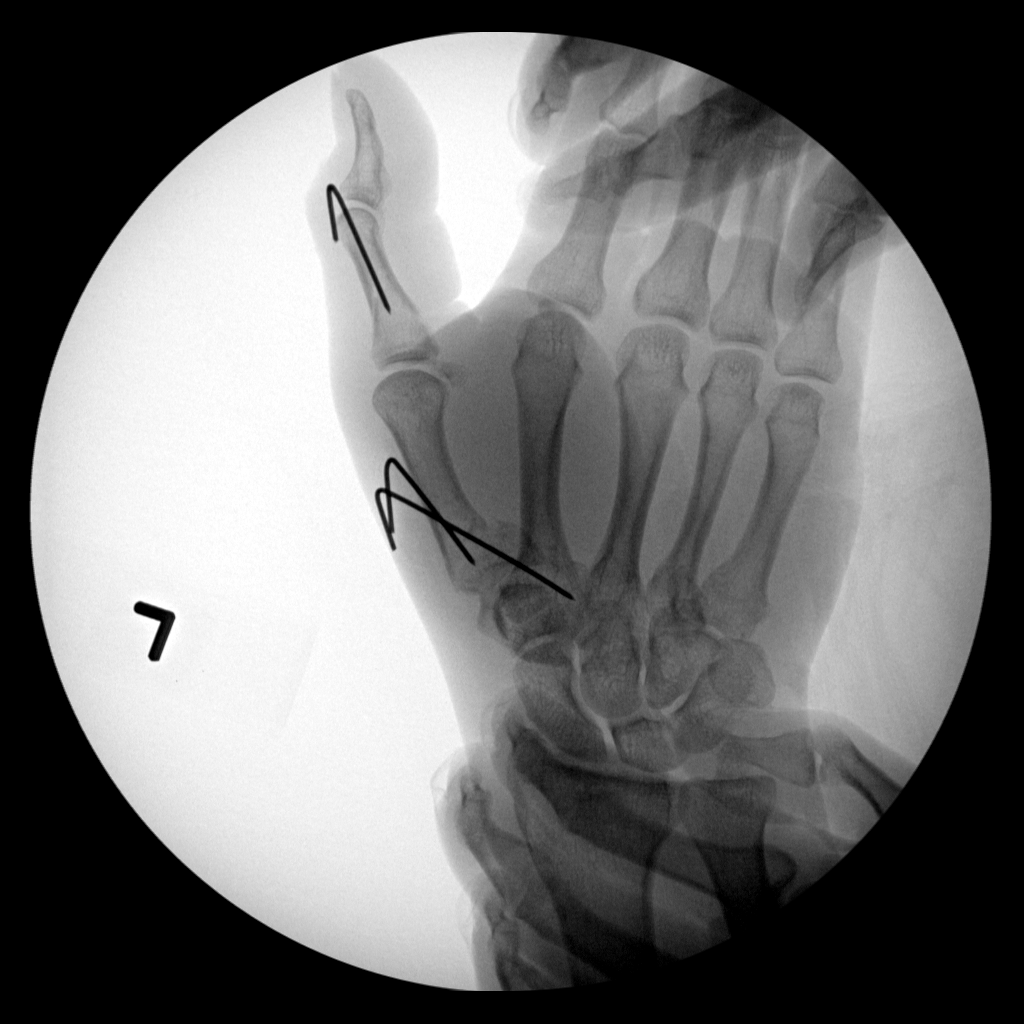

[2 of 2 positions shown; findings below may reference images not displayed]

FLUOROSCOPY TIME:  48 seconds.

C-arm fluoroscopic images were obtained intraoperatively and
submitted for post operative interpretation.
FINDINGS: Images demonstrate AP and lateral intraoperative fluoroscopic
interval pinning of the first proximal phalanx and base of the first
metacarpal fractures. Alignment is near anatomic. No acute
abnormality.
IMPRESSION: 1. Interval pinning of the first proximal phalanx and first
metacarpal base fractures, now in near anatomic alignment.

## 2020-10-16 ENCOUNTER — Ambulatory Visit: Payer: Self-pay | Attending: Internal Medicine

## 2020-10-16 DIAGNOSIS — Z23 Encounter for immunization: Secondary | ICD-10-CM

## 2020-10-16 NOTE — Progress Notes (Signed)
   Covid-[facility] Vaccination [facility]  Name:  Bruce Lee    MRN: [id number] DOB: 04/18/1976  10/16/2020  Bruce Lee was observed post Covid-19 immunization for 15 minutes without incident. He was provided with Vaccine Information Sheet and instruction to access the V-Safe system.   Bruce Lee was instructed to call 911 with any severe reactions post vaccine: Marland Kitchen Difficulty breathing  . Swelling of face and throat  . A fast heartbeat  . A bad rash all over body  . Dizziness and weakness   Immunizations Administered    Name Date Dose VIS Date Route   Pfizer COVID-19 Vaccine 10/16/2020 11:47 AM 0.3 mL 09/01/2020 Intramuscular   Manufacturer: [employer], [employer]   Lot: [id number]   NDC: [address]-9
